# Patient Record
Sex: Female | Born: 1980 | Hispanic: Yes | Marital: Married | Smoking: Never smoker
Health system: Southern US, Community
[De-identification: ages and names within clinical notes are randomized; demographics above are authoritative.]

## PROBLEM LIST (undated history)

## (undated) DIAGNOSIS — R51 Headache: Secondary | ICD-10-CM

## (undated) DIAGNOSIS — R519 Headache, unspecified: Secondary | ICD-10-CM

---

## 2015-02-22 ENCOUNTER — Emergency Department (HOSPITAL_BASED_OUTPATIENT_CLINIC_OR_DEPARTMENT_OTHER)
Admission: EM | Admit: 2015-02-22 | Discharge: 2015-02-23 | Disposition: A | Discharge status: Home / Self Care | Payer: Managed Care, Other (non HMO) | Attending: Emergency Medicine | Admitting: Emergency Medicine

## 2015-02-22 ENCOUNTER — Emergency Department (HOSPITAL_BASED_OUTPATIENT_CLINIC_OR_DEPARTMENT_OTHER): Payer: Managed Care, Other (non HMO)

## 2015-02-22 ENCOUNTER — Encounter (HOSPITAL_BASED_OUTPATIENT_CLINIC_OR_DEPARTMENT_OTHER): Payer: Self-pay | Admitting: *Deleted

## 2015-02-22 DIAGNOSIS — R110 Nausea: Secondary | ICD-10-CM | POA: Diagnosis not present

## 2015-02-22 DIAGNOSIS — R197 Diarrhea, unspecified: Secondary | ICD-10-CM | POA: Diagnosis not present

## 2015-02-22 DIAGNOSIS — Z3202 Encounter for pregnancy test, result negative: Secondary | ICD-10-CM | POA: Insufficient documentation

## 2015-02-22 DIAGNOSIS — R109 Unspecified abdominal pain: Secondary | ICD-10-CM | POA: Diagnosis present

## 2015-02-22 DIAGNOSIS — Z9889 Other specified postprocedural states: Secondary | ICD-10-CM | POA: Insufficient documentation

## 2015-02-22 DIAGNOSIS — R1010 Upper abdominal pain, unspecified: Secondary | ICD-10-CM | POA: Insufficient documentation

## 2015-02-22 DIAGNOSIS — R1031 Right lower quadrant pain: Secondary | ICD-10-CM | POA: Insufficient documentation

## 2015-02-22 LAB — URINALYSIS, ROUTINE W REFLEX MICROSCOPIC
BILIRUBIN URINE: NEGATIVE
Glucose, UA: NEGATIVE mg/dL
Ketones, ur: NEGATIVE mg/dL
Leukocytes, UA: NEGATIVE
NITRITE: NEGATIVE
PH: 7 (ref 5.0–8.0)
Protein, ur: NEGATIVE mg/dL
Specific Gravity, Urine: 1.005 (ref 1.005–1.030)
Urobilinogen, UA: 0.2 mg/dL (ref 0.0–1.0)

## 2015-02-22 LAB — URINE MICROSCOPIC-ADD ON

## 2015-02-22 LAB — PREGNANCY, URINE: Preg Test, Ur: NEGATIVE

## 2015-02-22 MED ORDER — IOHEXOL 300 MG/ML  SOLN
100.0000 mL | Freq: Once | INTRAMUSCULAR | Status: AC | PRN
  Administered 2015-02-23: 100 mL via INTRAVENOUS

## 2015-02-22 MED ORDER — SODIUM CHLORIDE 0.9 % IV SOLN
1000.0000 mL | Freq: Once | INTRAVENOUS | Status: AC
  Administered 2015-02-23: 1000 mL via INTRAVENOUS

## 2015-02-22 MED ORDER — ONDANSETRON HCL 4 MG/2ML IJ SOLN
4.0000 mg | Freq: Once | INTRAMUSCULAR | Status: AC
  Administered 2015-02-23: 4 mg via INTRAVENOUS
  Filled 2015-02-22: qty 2

## 2015-02-22 MED ORDER — SODIUM CHLORIDE 0.9 % IV SOLN
1000.0000 mL | INTRAVENOUS | Status: DC
Start: 2015-02-22 — End: 2015-02-23

## 2015-02-22 MED ORDER — MORPHINE SULFATE 4 MG/ML IJ SOLN
4.0000 mg | Freq: Once | INTRAMUSCULAR | Status: AC
  Administered 2015-02-23: 4 mg via INTRAVENOUS
  Filled 2015-02-22: qty 1

## 2015-02-22 NOTE — ED Notes (Addendum)
C/o abd pain, onset last night at 2000, last ate last night at 1700. Pinpoints pain to R flank and R abd. Also nausea and diarrhea. (denies: vomiting, fever, sob, urinary or vaginal sx, bleeding, flatus, belching abd surgeries or other sx), tried pepto bismol at 1700 tonight (no relief). Reports diarrhea x20 since last night (watery and yellow), LMP 5/28.

## 2015-02-22 NOTE — ED Notes (Signed)
Pt c/o abd pain and nausea x 1 day

## 2015-02-22 NOTE — ED Provider Notes (Signed)
CSN: [id number]     Arrival date & time 02/22/15  2102 History  This chart was scribed for Brenda Booze, MD by Octavia Heir, ED Scribe. This patient was seen in room MH06/MH06 and the patient's care was started at 11:30 PM.    Chief Complaint  Patient presents with  . Abdominal Pain     The history is provided by the patient. No language interpreter was used.    HPI Comments: Brenda Ryan is a 34 y.o. female who presents to the Emergency Department complaining of constant, gradual worsening abdominal pain onset yesterday. Pt has associated diarrhea and nausea. She notes when she ate today, the pain did not get worse. Pt has tried OTC pepto bismol to alleviate the symptoms with no relief.  She denies fever,and trouble urinating.  History reviewed. No pertinent past medical history. Past Surgical History  Procedure Laterality Date  . Cesarean section     History reviewed. No pertinent family history. History  Substance Use Topics  . Smoking status: Never Smoker   . Smokeless tobacco: Not on file  . Alcohol Use: No   OB History    No data available     Review of Systems  Constitutional: Negative for fever.  Gastrointestinal: Positive for nausea, abdominal pain and diarrhea. Negative for vomiting.  All other systems reviewed and are negative.     Allergies  Penicillins  Home Medications   Prior to Admission medications   Not on File   Triage vitals: BP 135/84 mmHg  Pulse 97  Temp(Src) 98.4 F (36.9 C)  Resp 16  Ht 5\' 3"  (1.6 m)  Wt 180 lb (81.647 kg)  BMI 31.89 kg/m2  SpO2 100%  LMP 02/16/2015 Physical Exam  Constitutional: She is oriented to person, place, and time. She appears well-developed and well-nourished. No distress.  HENT:  Head: Normocephalic.  Eyes: Conjunctivae are normal. Pupils are equal, round, and reactive to light. No scleral icterus.  Neck: Normal range of motion. Neck supple. No JVD present. No thyromegaly present.   Cardiovascular: Normal rate and regular rhythm.  Exam reveals no gallop and no friction rub.   No murmur heard. Pulmonary/Chest: Effort normal and breath sounds normal. No respiratory distress. She has no wheezes. She has no rales. She exhibits no tenderness.  Abdominal: Soft. Bowel sounds are normal. She exhibits no distension and no mass. There is tenderness. There is no rebound and no guarding.  Moderate tenderness across the upper abdomen and right mid and lower abdomen No rebound or guarding Bowel sounds decreased Moderate right CVA tenderness, mild left CVA tenderness  Musculoskeletal: Normal range of motion. She exhibits no edema.  Lymphadenopathy:    She has no cervical adenopathy.  Neurological: She is alert and oriented to person, place, and time.  Skin: Skin is warm and dry. No rash noted.  Psychiatric: She has a normal mood and affect. Her behavior is normal. Judgment and thought content normal.  Nursing note and vitals reviewed.   ED Course  Procedures  DIAGNOSTIC STUDIES: Oxygen Saturation is 100% on RA, normal by my interpretation.  COORDINATION OF CARE:  11:32 PM Discussed treatment plan which includes CT scan, nausea medication with pt at bedside and pt agreed to plan.   Labs Review Results for orders placed or performed during the hospital encounter of 02/22/15  Pregnancy, urine  Result Value Ref Range   Preg Test, Ur NEGATIVE NEGATIVE  Urinalysis, Routine w reflex microscopic (not at [facility])  Result Value Ref Range  Color, Urine YELLOW YELLOW   APPearance CLEAR CLEAR   Specific Gravity, Urine 1.005 1.005 - 1.030   pH 7.0 5.0 - 8.0   Glucose, UA NEGATIVE NEGATIVE mg/dL   Hgb urine dipstick TRACE (A) NEGATIVE   Bilirubin Urine NEGATIVE NEGATIVE   Ketones, ur NEGATIVE NEGATIVE mg/dL   Protein, ur NEGATIVE NEGATIVE mg/dL   Urobilinogen, UA 0.2 0.0 - 1.0 mg/dL   Nitrite NEGATIVE NEGATIVE   Leukocytes, UA NEGATIVE NEGATIVE  Urine microscopic-add on  Result  Value Ref Range   Squamous Epithelial / LPF RARE RARE   RBC / HPF 0-2 <3 RBC/hpf   Bacteria, UA RARE RARE  Comprehensive metabolic panel  Result Value Ref Range   Sodium 139 135 - 145 mmol/L   Potassium 3.4 (L) 3.5 - 5.1 mmol/L   Chloride 102 101 - 111 mmol/L   CO2 26 22 - 32 mmol/L   Glucose, Bld 97 65 - 99 mg/dL   BUN 9 6 - 20 mg/dL   Creatinine, Ser 0.45 0.44 - 1.00 mg/dL   Calcium 9.8 8.9 - 40.9 mg/dL   Total Protein 7.8 6.5 - 8.1 g/dL   Albumin 4.5 3.5 - 5.0 g/dL   AST 18 15 - 41 U/L   ALT 22 14 - 54 U/L   Alkaline Phosphatase 114 38 - 126 U/L   Total Bilirubin 1.3 (H) 0.3 - 1.2 mg/dL   GFR calc non Af Amer >60 >60 mL/min   GFR calc Af Amer >60 >60 mL/min   Anion gap 11 5 - 15  Lipase, blood  Result Value Ref Range   Lipase 14 (L) 22 - 51 U/L  CBC with Differential  Result Value Ref Range   WBC 8.0 4.0 - 10.5 K/uL   RBC 4.62 3.87 - 5.11 MIL/uL   Hemoglobin 14.2 12.0 - 15.0 g/dL   HCT 81.1 91.4 - 78.2 %   MCV 90.7 78.0 - 100.0 fL   MCH 30.7 26.0 - 34.0 pg   MCHC 33.9 30.0 - 36.0 g/dL   RDW 95.6 21.3 - 08.6 %   Platelets 351 150 - 400 K/uL   Neutrophils Relative % 66 43 - 77 %   Neutro Abs 5.3 1.7 - 7.7 K/uL   Lymphocytes Relative 26 12 - 46 %   Lymphs Abs 2.1 0.7 - 4.0 K/uL   Monocytes Relative 6 3 - 12 %   Monocytes Absolute 0.5 0.1 - 1.0 K/uL   Eosinophils Relative 2 0 - 5 %   Eosinophils Absolute 0.1 0.0 - 0.7 K/uL   Basophils Relative 0 0 - 1 %   Basophils Absolute 0.0 0.0 - 0.1 K/uL   Imaging Review Ct Abdomen Pelvis W Contrast  02/23/2015   CLINICAL DATA:  Abdominal pain for 1 day.  Nausea and vomiting.  EXAM: CT ABDOMEN AND PELVIS WITH CONTRAST  TECHNIQUE: Multidetector CT imaging of the abdomen and pelvis was performed using the standard protocol following bolus administration of intravenous contrast.  CONTRAST:  OMNIPAQUE IOHEXOL 300 MG/ML  SOLN  COMPARISON:  None.  FINDINGS: Mild dependent changes in the lung bases.  The liver, spleen,  gallbladder, pancreas, adrenal glands, kidneys, abdominal aorta, inferior vena cava, and retroperitoneal lymph nodes are unremarkable. Stomach, small bowel, and colon are not abnormally distended. Small amount of fat in the umbilicus. No free air or free fluid in the abdomen.  Pelvis: Uterus and ovaries are not enlarged. Mild thickening of the wall of the bladder diffusely may indicate cystitis.  Appendix is normal. No free or loculated pelvic fluid collections. No pelvic mass or lymphadenopathy. No destructive bone lesions.  IMPRESSION: Mild diffuse bladder wall thickening may indicate cystitis. Examination is otherwise unremarkable.   Electronically Signed   By: Burman Nieves M.D.   On: 02/23/2015 01:12     MDM   Final diagnoses:  Abdominal pain, unspecified abdominal location    Abdominal pain of uncertain cause. She does have significant tenderness in the right lower quadrant. CT will be obtained to make sure that this is not an atypical presentation of appendicitis. She's given IV hydration, morphine, ondansetron.  CT has come back remarkable only for bladder wall thickening. However, she has no urinary symptoms and urinalysis is normal. WBC is normal with normal differential. Cause for pain is not clear but there is no evidence of serious pathology. She is discharged with prescription for oxycodone have acetaminophen, and ondansetron. She's to return should symptoms worsen.  I personally performed the services described in this documentation, which was scribed in my presence. The recorded information has been reviewed and is accurate. \    Brenda Booze, MD 02/23/15 [phone]

## 2015-02-22 NOTE — ED Notes (Signed)
Dr. Glick at BS 

## 2015-02-23 LAB — COMPREHENSIVE METABOLIC PANEL
ALT: 22 U/L (ref 14–54)
ANION GAP: 11 (ref 5–15)
AST: 18 U/L (ref 15–41)
Albumin: 4.5 g/dL (ref 3.5–5.0)
Alkaline Phosphatase: 114 U/L (ref 38–126)
BILIRUBIN TOTAL: 1.3 mg/dL — AB (ref 0.3–1.2)
BUN: 9 mg/dL (ref 6–20)
CHLORIDE: 102 mmol/L (ref 101–111)
CO2: 26 mmol/L (ref 22–32)
Calcium: 9.8 mg/dL (ref 8.9–10.3)
Creatinine, Ser: 0.64 mg/dL (ref 0.44–1.00)
GFR calc Af Amer: >60 mL/min (ref >60)
Glucose, Bld: 97 mg/dL (ref 65–99)
Potassium: 3.4 mmol/L — ABNORMAL LOW (ref 3.5–5.1)
Sodium: 139 mmol/L (ref 135–145)
Total Protein: 7.8 g/dL (ref 6.5–8.1)

## 2015-02-23 LAB — CBC WITH DIFFERENTIAL/PLATELET
Basophils Absolute: 0 10*3/uL (ref 0.0–0.1)
Basophils Relative: 0 % (ref 0–1)
EOS PCT: 2 % (ref 0–5)
Eosinophils Absolute: 0.1 10*3/uL (ref 0.0–0.7)
HCT: 41.9 % (ref 36.0–46.0)
HEMOGLOBIN: 14.2 g/dL (ref 12.0–15.0)
LYMPHS PCT: 26 % (ref 12–46)
Lymphs Abs: 2.1 10*3/uL (ref 0.7–4.0)
MCH: 30.7 pg (ref 26.0–34.0)
MCHC: 33.9 g/dL (ref 30.0–36.0)
MCV: 90.7 fL (ref 78.0–100.0)
Monocytes Absolute: 0.5 10*3/uL (ref 0.1–1.0)
Monocytes Relative: 6 % (ref 3–12)
NEUTROS ABS: 5.3 10*3/uL (ref 1.7–7.7)
Neutrophils Relative %: 66 % (ref 43–77)
Platelets: 351 10*3/uL (ref 150–400)
RBC: 4.62 MIL/uL (ref 3.87–5.11)
RDW: 12.6 % (ref 11.5–15.5)
WBC: 8 10*3/uL (ref 4.0–10.5)

## 2015-02-23 LAB — LIPASE, BLOOD: LIPASE: 14 U/L — AB (ref 22–51)

## 2015-02-23 MED ORDER — OXYCODONE-ACETAMINOPHEN 5-325 MG PO TABS: 1.0000 | ORAL_TABLET | ORAL | Status: AC | PRN

## 2015-02-23 MED ORDER — ONDANSETRON HCL 4 MG PO TABS: 4.0000 mg | ORAL_TABLET | Freq: Four times a day (QID) | ORAL | Status: AC | PRN

## 2015-02-23 NOTE — ED Notes (Signed)
Pt to CT via stretcher

## 2015-02-23 NOTE — ED Notes (Signed)
Pt alert, NAD, calm, interactive, VSS, reports "feel better, pain decreased, 8/10", up to b/r, steady gait, husband at side.

## 2015-02-23 NOTE — Discharge Instructions (Signed)
Return to the emergency department if pain is getting worse.  Dolor abdominal (Abdominal Pain) El dolor puede tener muchas causas. Normalmente la causa del dolor abdominal no es una enfermedad y Scientist, clinical (histocompatibility and immunogenetics) sin TEFL teacher. Frecuentemente puede controlarse y tratarse en casa. Su mdico le Medical sales representative examen fsico y posiblemente solicite anlisis de sangre y radiografas para ayudar a Chief Strategy Officer la gravedad de su dolor. Sin embargo, en [employer], debe transcurrir ms tiempo antes de que se pueda Clinical research associate una causa evidente del dolor. Antes de llegar a ese punto, es posible que su mdico no sepa si necesita ms pruebas o un tratamiento ms profundo. INSTRUCCIONES PARA EL CUIDADO EN EL HOGAR  Est atento al dolor para ver si hay cambios. Las siguientes indicaciones ayudarn a Architectural technologist que pueda sentir:  [place] solo medicamentos de venta libre o recetados, segn las indicaciones del mdico.  No tome laxantes a menos que se lo haya indicado su mdico.  Pruebe con Neomia Dear dieta lquida absoluta (caldo, t o agua) segn se lo indique su mdico. Introduzca gradualmente una dieta normal, segn su tolerancia. SOLICITE ATENCIN MDICA SI:  Tiene dolor abdominal sin explicacin.  Tiene dolor abdominal relacionado con nuseas o diarrea.  Tiene dolor cuando orina o defeca.  Experimenta dolor abdominal que lo despierta de noche.  Tiene dolor abdominal que empeora o mejora cuando come alimentos.  Tiene dolor abdominal que empeora cuando come alimentos grasosos.  Tiene fiebre. SOLICITE ATENCIN MDICA DE INMEDIATO SI:   El dolor no desaparece en un plazo mximo de 2horas.  No deja de (vomitar).  El Engineer, mining se siente solo en partes del abdomen, como el lado derecho o la parte inferior izquierda del abdomen.  Evaca materia fecal sanguinolenta o negra, de aspecto alquitranado. ASEGRESE DE QUE:  Comprende estas instrucciones.  Controlar su afeccin.  Recibir ayuda de inmediato  si no mejora o si empeora. Document Released: 08/27/2005 Document Revised: 09/01/2013 [facility] Patient Information 2015 [place], [place]. This information is not intended to replace advice given to you by your health care provider. Make sure you discuss any questions you have with your health care provider.  Acetaminophen; Oxycodone tablets Qu es este medicamento? El compuesto ACETAMINOFENO; OXICODONA es un analgsico. Se utiliza para tratar los dolores leves a moderados. Este medicamento puede ser utilizado para otros usos; si tiene alguna pregunta consulte con su proveedor de atencin mdica o con su farmacutico. MARCAS COMERCIALES DISPONIBLES: Endocet, Magnacet, Narvox, Percocet, Perloxx, Primalev, Primlev, Roxicet, Xolox [employer] debo informar a mi profesional de la salud antes de tomar este medicamento? Necesita saber si usted presenta alguno de los [employer] o situaciones: -tumor cerebral -enfermedad de Crohn, enfermedad intestinal inflamatoria o colitis ulcerativa -abuso de drogas o drogadiccin -lesin de la cabeza -problemas cardiacos o circulatorios -si consume alcohol con frecuencia -enfermedad renal o problemas al orinar -enfermedad heptica -enfermedad pulmonar, asma o dificultades al respirar -una reaccin alrgica o inusual al acetaminofeno, a la oxicodona, a otros analgsicos opiceos, a otros medicamentos, alimentos, colorantes o conservantes -si est embarazada o buscando quedar embarazada -si est amamantando a un beb Cmo debo utilizar este medicamento? Tome este medicamento por va oral con un vaso lleno de agua. Siga las instrucciones de la etiqueta del [place]. Tome sus dosis a intervalos regulares. No tome su medicamento con una frecuencia mayor que la indicada. Hable con su pediatra para informarse acerca del uso de este medicamento en nios. Puede requerir Customer service manager. Los pacientes de ms de 65 aos de edad pueden presentar  reacciones ms  fuertes a este medicamento y Pension scheme manager dosis menores. Sobredosis: Pngase en contacto inmediatamente con un centro toxicolgico o una sala de urgencia si usted cree que haya tomado demasiado medicamento. ATENCIN: [employer] es solo para usted. No comparta este medicamento con nadie. Qu sucede si me olvido de una dosis? Si olvida una dosis, tmela lo antes posible. Si es casi la hora de la prxima dosis, tome slo esa dosis. No tome dosis adicionales o dobles. Qu puede interactuar con este medicamento? -alcohol -antihistamnicos -barbitricos tales como el amobarbital, butalbital, butabarbital, metohexital, pentobarbital, fenobarbital, tiopental y secobarbital -benztropina -medicamentos para problemas de vejiga, tales como solifenacina, trospium, oxibutinina, tolterodina, hiosciamina y metscopolamina -medicamentos para problemas respiratorios, tales como ipratropio y tiotropio -medicamentos para ciertos problemas estomacales o intestinales, tales como propantelina, homatropina metilbromuro, Dispensing optician, atropina, belladona y diciclomina -anestsicos generales, tales como etomidato, [place], xido nitroso, propofol, desflurano, enflurano, halotano, isoflurano y sevoflurano -medicamentos para la depresin, ansiedad o trastornos psicticos -medicamentos para dormir -relajantes musculares -naltrexona -medicamentos narcticos (opiceos) para Chief Technology Officer -fenotiazinas, tales como perfenacina, tioridazina, clorpromacina, mesoridazina, flufenazina, proclorperazina, promazina y trifluoperazina -escopolamina -tramadol -trihexifenidilo Puede ser que esta lista no menciona todas las posibles interacciones. Informe a su profesional de [employer] de [employer] productos a base de hierbas, medicamentos de [place] o suplementos nutritivos que est tomando. Si usted fuma, consume bebidas alcohlicas o si utiliza drogas ilegales, indqueselo tambin a su profesional de [employer]. Algunas sustancias  pueden interactuar con su medicamento. A qu debo estar atento al usar [employer]? Si el dolor no desaparece, si empeora o si experimenta un dolor nuevo o de tipo diferente, consulte a su mdico o a su profesional de [employer]. Usted puede desarrollar tolerancia al medicamento. La tolerancia significa que necesitar una dosis ms alta para Engineer, materials. Tolerancia es normal y esperada cuando est tomando este medicamento por un largo perodo de [place]. No suspenda el uso de su medicamento repentinamente debido a que puede Copywriter, advertising reaccin severa. Su cuerpo se acostumbra a Industrial/product designer. Esto NO significa que sea adicto. La adiccin es un comportamiento que hace referencia a la obtencin y utilizacin de un medicamento con fines que no son mdicos. Si tiene Engineer, mining, existe una razn mdica para que usted tome un analgsico. Su mdico le indicar la cantidad de medicamento que Mudlogger. Si su mdico desea que [employer], la dosis ser reducida gradualmente para Psychiatric nurse secundarios. Puede experimentar somnolencia o mareos. No conduzca ni utilice maquinaria ni haga nada que Scientist, research (life sciences) en estado de alerta hasta que sepa cmo le afecta este medicamento. No se siente ni se ponga de pie con rapidez, especialmente si es un paciente de edad avanzada. Esto reduce el riesgo de mareos o [employer]. El alcohol puede interferir con el efecto de [place]. Evite consumir bebidas alcohlicas. Hay distintos tipos de medicamentos narcticos (opiceos) para Chief Technology Officer. Si usted toma ms que un tipo a la [employer], podr tener ms [employer]. Dar a su proveedor de atencin medica una lista de todos los medicamentos que usted [place]. Su mdico le informar la cantidad de medicamento que Loss adjuster, chartered. No tome ms medicamento que lo indicado. Comunquese con emergencia para ayuda si tiene problemas para respirar. Este medicamento causar estreimiento. Trate de evacuar  los intestinos al menos cada 2  3 das. Si no evacua los intestinos durante [address]  [address], comunquese con su mdico o con su profesional de [employer]. No tome Tylenol (acetaminofeno) u  otros medicamentos que contienen acetaminofeno con [employer]. Tomando mucho acetaminofeno puede ser muy peligroso. Muchos medicamentos de venta libre contienen acetaminofeno. Lea siempre las etiquetas cuidadosamente para evitar el tomar ms acetaminofeno. Qu efectos secundarios puedo tener al [employer] este medicamento? Efectos secundarios que debe informar a su mdico o a Producer, television/film/video de la salud tan pronto como sea posible: -Scientist, physiological, tales como erupcin cutnea, picazn o urticarias, hinchazn de la cara, labios o lengua -dificultades respiratorias, sibilancias -confusin -sensacin de desmayos o aturdimiento -dolor de estmago severo -cansancio o debilidad inusual -color amarillento de los ojos o la piel Efectos secundarios que, por lo general, no requieren atencin mdica (debe informarlos a su mdico o a su profesional de la salud si persisten o si son molestos): -mareos -somnolencia -nuseas -vmitos Puede ser que esta lista no menciona todos los posibles efectos secundarios. Comunquese a su mdico por asesoramiento mdico [employer]. Usted puede informar los efectos secundarios a la FDA por telfono al 1-800-FDA-1088. Dnde debo guardar mi medicina? Mantngala fuera del alcance de los nios. Este medicamento puede ser abusado. Mantenga su medicamento en un lugar seguro para protegerlo contra robos. No comparta este medicamento con nadie. Es peligroso vender o ceder este medicamento y est prohibido por la ley. Gurdelo a [employer], entre 20 y 25 grados C (72 y 49 grados F). Mantenga el envase bien cerrado. Protjalo de Statistician. Este medicamento puede causar muerte y sobredosis accidental si es tomado por otros adultos, nios o Neurosurgeon. Tire los medicamentos  que no haya utilizado al inodoro para reducir la posibilidad de dao. No use el medicamento despus de la fecha de vencimiento. ATENCIN: Este folleto es un resumen. Puede ser que no cubra toda la posible informacin. Si usted tiene preguntas acerca de esta medicina, consulte con su mdico, su farmacutico o su profesional de Radiographer, therapeutic.  2015, Elsevier/Gold Standard. (2013-05-11 19:44:45)  Ondansetron tablets Qu es este medicamento? El ONDANSETRN se [place] para tratar las nuseas y el vmito causados por la quimioterapia. Tambin se puede Chemical engineer para prevenir o tratar las nuseas y el vmito despus de una operacin. Este medicamento puede ser utilizado para otros usos; si tiene alguna pregunta consulte con su proveedor de atencin mdica o con su farmacutico. MARCAS COMERCIALES DISPONIBLES: Zofran Qu le debo informar a mi profesional de la salud antes de tomar este medicamento? Necesita saber si usted presenta alguno de los siguientes problemas o situaciones: -enfermedad cardiaca -antecedentes de pulso cardiaco irregular -enfermedad heptica -niveles bajos de magnesio o potasio en la sangre -una reaccin alrgica o inusual al Eduard Clos, granisetrn, a otros medicamentos, alimentos, colorantes o conservantes -si est embarazada o buscando quedar embarazada -si est amamantando a un beb Cmo debo [employer]? Tome este medicamento por va oral con un vaso de agua. Siga las instrucciones de la etiqueta del [place]. Tome sus dosis a intervalos regulares. No tome su medicamento con una frecuencia mayor que la indicada. Hable con su pediatra para informarse acerca del uso de este medicamento en nios. Puede requerir atencin especial. Sobredosis: Pngase en contacto inmediatamente con un centro toxicolgico o una sala de urgencia si usted cree que haya tomado demasiado medicamento. ATENCIN: [employer] es solo para usted. No comparta este medicamento con  nadie. Qu sucede si me olvido de una dosis? Si olvida una dosis, tmela lo antes posible. Si es casi la hora de la prxima dosis, tome slo esa dosis. No tome dosis adicionales o dobles. Qu puede interactuar con [employer]  medicamento? No tome esta medicina con ninguno de los siguientes medicamentos: -apomorfina -ciertos medicamentos para infecciones micticas, tales como fluconazol, quetoconazol, itraconazol, posaconazol, voriconazol -cisapride -dofetilida -dronedarona -pimozida -tioridazina -ziprasidona Esta medicina tambin puede interactuar con los siguientes medicamentos: -carbamazepina -ciertos medicamentos para la depresin, ansiedad o trastornos psicticos -fentanilo -linezolid -IMAOs, tales como Carbex, Eldepryl, Marplan, Nardil y [place]arn[place] -azul de metileno (inyeccin por va intravenosa) -otros medicamentos que prolongan el intervalo QT (causa un ritmo cardiaco anormal) -fenitona -rifampicina -tramadol Puede ser que esta lista no menciona todas las posibles interacciones. Informe a su profesional de [employer] de [employer] productos a base de hierbas, medicamentos de [place] o suplementos nutritivos que est tomando. Si usted fuma, consume bebidas alcohlicas o si utiliza drogas ilegales, indqueselo tambin a su profesional de [employer]. Algunas sustancias pueden interactuar con su medicamento. A qu debo estar atento al usar [employer]? Si experimenta algn signo de reaccin alrgica, consulte a su mdico o a su profesional de [employer] lo antes posible. Qu efectos secundarios puedo tener al [employer] este medicamento? Efectos secundarios que debe informar a su mdico o a Producer, television/film/video de la salud tan pronto como sea posible: -Therapist, art como erupcin cutnea, picazn o urticarias, hinchazn de la cara, labios o lengua -problemas respiratorios -confusin -mareos -pulso cardiaco rpido o irregular -sensacin de desmayos o aturdimiento, cadas -fiebre y  escalofros -prdida del equilibrio o coordinacin -convulsiones -sudoracin -hinchazn de las manos o pies -opresin en el pecho -temblores -cansancio o debilidad inusual Efectos secundarios que, por lo general, no requieren atencin mdica (debe informarlos a su mdico o a su profesional de la salud si persisten o si son molestos): -estreimiento o diarrea -dolor de cabeza Puede ser que esta lista no menciona todos los posibles efectos secundarios. Comunquese a su mdico por asesoramiento mdico [employer]. Usted puede informar los efectos secundarios a la FDA por telfono al 1-800-FDA-1088. Dnde debo guardar mi medicina? Mantngala fuera del alcance de los nios. Gurdela a una temperatura de [place] 2 y 30 grados C (36 y 33 grados F). Deseche todo el medicamento que no haya utilizado, despus de la fecha de vencimiento. ATENCIN: Este folleto es un resumen. Puede ser que no cubra toda la posible informacin. Si usted tiene preguntas acerca de esta medicina, consulte con su mdico, su farmacutico o su profesional de Radiographer, therapeutic.  2015, Elsevier/Gold Standard. (2013-06-16 13:57:48)

## 2016-10-01 ENCOUNTER — Emergency Department (HOSPITAL_BASED_OUTPATIENT_CLINIC_OR_DEPARTMENT_OTHER): Payer: BLUE CROSS/BLUE SHIELD

## 2016-10-01 ENCOUNTER — Encounter (HOSPITAL_BASED_OUTPATIENT_CLINIC_OR_DEPARTMENT_OTHER): Payer: Self-pay | Admitting: *Deleted

## 2016-10-01 ENCOUNTER — Emergency Department (HOSPITAL_BASED_OUTPATIENT_CLINIC_OR_DEPARTMENT_OTHER)
Admission: EM | Admit: 2016-10-01 | Discharge: 2016-10-01 | Disposition: A | Discharge status: Home / Self Care | Payer: BLUE CROSS/BLUE SHIELD | Attending: Emergency Medicine | Admitting: Emergency Medicine

## 2016-10-01 DIAGNOSIS — Y939 Activity, unspecified: Secondary | ICD-10-CM | POA: Insufficient documentation

## 2016-10-01 DIAGNOSIS — W3400XA Accidental discharge from unspecified firearms or gun, initial encounter: Secondary | ICD-10-CM | POA: Insufficient documentation

## 2016-10-01 DIAGNOSIS — Y999 Unspecified external cause status: Secondary | ICD-10-CM | POA: Diagnosis not present

## 2016-10-01 DIAGNOSIS — Y929 Unspecified place or not applicable: Secondary | ICD-10-CM | POA: Insufficient documentation

## 2016-10-01 DIAGNOSIS — S5002XA Contusion of left elbow, initial encounter: Secondary | ICD-10-CM | POA: Insufficient documentation

## 2016-10-01 DIAGNOSIS — Z79899 Other long term (current) drug therapy: Secondary | ICD-10-CM | POA: Diagnosis not present

## 2016-10-01 DIAGNOSIS — S59902A Unspecified injury of left elbow, initial encounter: Secondary | ICD-10-CM | POA: Diagnosis present

## 2016-10-01 MED ORDER — IBUPROFEN 400 MG PO TABS
400.0000 mg | ORAL_TABLET | Freq: Once | ORAL | Status: AC
  Administered 2016-10-01: 400 mg via ORAL
  Filled 2016-10-01: qty 1
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
## 2016-10-01 NOTE — ED Notes (Signed)
[employer] officer at bedside talking to patient
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
## 2017-09-25 ENCOUNTER — Encounter (HOSPITAL_BASED_OUTPATIENT_CLINIC_OR_DEPARTMENT_OTHER): Payer: Self-pay

## 2017-09-25 ENCOUNTER — Emergency Department (HOSPITAL_BASED_OUTPATIENT_CLINIC_OR_DEPARTMENT_OTHER)
Admission: EM | Admit: 2017-09-25 | Discharge: 2017-09-25 | Disposition: A | Discharge status: Home / Self Care | Payer: BLUE CROSS/BLUE SHIELD | Attending: Emergency Medicine | Admitting: Emergency Medicine

## 2017-09-25 ENCOUNTER — Other Ambulatory Visit: Payer: Self-pay

## 2017-09-25 DIAGNOSIS — R51 Headache: Secondary | ICD-10-CM | POA: Diagnosis present

## 2017-09-25 DIAGNOSIS — R519 Headache, unspecified: Secondary | ICD-10-CM

## 2017-09-25 HISTORY — DX: Headache, unspecified: R51.9

## 2017-09-25 HISTORY — DX: Headache: R51

## 2017-09-25 LAB — PREGNANCY, URINE: Preg Test, Ur: NEGATIVE

## 2017-09-25 MED ORDER — KETOROLAC TROMETHAMINE 30 MG/ML IJ SOLN
30.0000 mg | Freq: Once | INTRAMUSCULAR | Status: AC
  Administered 2017-09-25: 30 mg via INTRAVENOUS
  Filled 2017-09-25: qty 1

## 2017-09-25 MED ORDER — SODIUM CHLORIDE 0.9 % IV BOLUS (SEPSIS)
1000.0000 mL | Freq: Once | INTRAVENOUS | Status: AC
  Administered 2017-09-25: 1000 mL via INTRAVENOUS

## 2017-09-25 MED ORDER — DIPHENHYDRAMINE HCL 50 MG/ML IJ SOLN
12.5000 mg | Freq: Once | INTRAMUSCULAR | Status: AC
  Administered 2017-09-25: 12.5 mg via INTRAVENOUS
  Filled 2017-09-25: qty 1

## 2017-09-25 MED ORDER — PROCHLORPERAZINE EDISYLATE 5 MG/ML IJ SOLN
10.0000 mg | Freq: Once | INTRAMUSCULAR | Status: AC
  Administered 2017-09-25: 10 mg via INTRAVENOUS
  Filled 2017-09-25: qty 2

## 2017-09-25 NOTE — ED Triage Notes (Addendum)
Pt c/o headache since Sunday with nausea and photosensitivity, not relieved by OTC meds.  Pt will need interpreter

## 2017-09-25 NOTE — ED Provider Notes (Signed)
[facility] EMERGENCY DEPARTMENT Provider Note   CSN: [id number] Arrival date & time: 09/25/17  1833     History   Chief Complaint Chief Complaint  Patient presents with  . Headache    HPI Brenda Ryan is a 37 y.o. female.  The history is provided by the patient and medical records. A language interpreter was used Dealer(Spanish stratus interpreter).  Headache     Brenda Ryan is a 37 y.o. female  with a PMH of headaches who presents to the Emergency Department complaining of headache x 3 days.  Pain is on the right side of the head, throbbing.  Associated with photophobia and nausea.  No vomiting.  No abdominal pain.  No fever or chills.  No medications taken prior to arrival for her symptoms.  She does have a history of similar headaches.  She has had to go to the emergency department before and got medication which improved symptoms.  No visual changes, dizziness, neck pain.    Past Medical History:  Diagnosis Date  . Headache     There are no active problems to display for this patient.   Past Surgical History:  Procedure Laterality Date  . CESAREAN SECTION      OB History    No data available       Home Medications    Prior to Admission medications   Medication Sig Start Date End Date Taking? Authorizing Provider  ondansetron (ZOFRAN) 4 MG tablet Take 1 tablet (4 mg total) by mouth every 6 (six) hours as needed for nausea or vomiting. 02/23/15   Dione BoozeGlick, David, MD  oxyCODONE-acetaminophen (PERCOCET) 5-325 MG per tablet Take 1 tablet by mouth every 4 (four) hours as needed for moderate pain. 02/23/15   Dione BoozeGlick, David, MD    Family History No family history on file.  Social History Social History   Tobacco Use  . Smoking status: Never Smoker  . Smokeless tobacco: Never Used  Substance Use Topics  . Alcohol use: No  . Drug use: No     Allergies   Penicillins   Review of Systems Review of Systems  Neurological:  Positive for headaches. Negative for dizziness, speech difficulty, weakness, light-headedness and numbness.     Physical Exam Updated Vital Signs BP 124/70 (BP Location: Right Wrist)   Pulse 82   Temp 98.2 F (36.8 C) (Oral)   Resp 18   Ht 5\' 2"  (1.575 m)   Wt 89.8 kg (198 lb)   LMP 09/02/2017   SpO2 100%   BMI 36.21 kg/m   Physical Exam  Constitutional: She is oriented to person, place, and time. She appears well-developed and well-nourished. No distress.  HENT:  Head: Normocephalic and atraumatic.  Mouth/Throat: Oropharynx is clear and moist.  No tenderness of the temporal artery   Eyes: Conjunctivae and EOM are normal. Pupils are equal, round, and reactive to light. No scleral icterus.  No nystagmus   Neck: Normal range of motion. Neck supple.  Full active and passive ROM without pain.  No midline or paraspinal tenderness. No nuchal rigidity or meningeal signs.  Cardiovascular: Normal rate, regular rhythm, normal heart sounds and intact distal pulses.  Pulmonary/Chest: Effort normal and breath sounds normal. No respiratory distress. She has no wheezes. She has no rales.  Abdominal: Soft. Bowel sounds are normal. She exhibits no distension. There is no tenderness. There is no rebound and no guarding.  Musculoskeletal: Normal range of motion.  Lymphadenopathy:    She has no cervical  adenopathy.  Neurological: She is alert and oriented to person, place, and time. She has normal reflexes. No cranial nerve deficit. Coordination normal.  Alert, oriented, thought content appropriate, able to give a coherent history. Speech is clear and goal oriented, able to follow commands.  Cranial Nerves:  II:  Peripheral visual fields grossly normal, pupils equal, round, reactive to light III, IV, VI: EOM intact bilaterally, ptosis not present V,VII: smile symmetric, eyes kept closed tightly against resistance, facial light touch sensation equal VIII: hearing grossly normal IX, X: symmetric  soft palate movement, uvula elevates symmetrically  XI: bilateral shoulder shrug symmetric and strong XII: midline tongue extension 5/5 muscle strength in upper and lower extremities bilaterally including strong and equal grip strength and dorsiflexion/plantar flexion Sensory to light touch normal in all four extremities.  Normal finger-to-nose and rapid alternating movements. No drift.  Skin: Skin is warm and dry. No rash noted. She is not diaphoretic.  Nursing note and vitals reviewed.    ED Treatments / Results  Labs (all labs ordered are listed, but only abnormal results are displayed) Labs Reviewed  PREGNANCY, URINE    EKG  EKG Interpretation None       Radiology No results found.  Procedures Procedures (including critical care time)  Medications Ordered in ED Medications  ketorolac (TORADOL) 30 MG/ML injection 30 mg (30 mg Intravenous Given 09/25/17 2115)    And  prochlorperazine (COMPAZINE) injection 10 mg (10 mg Intravenous Given 09/25/17 2118)    And  diphenhydrAMINE (BENADRYL) injection 12.5 mg (12.5 mg Intravenous Given 09/25/17 2116)    And  sodium chloride 0.9 % bolus 1,000 mL (1,000 mLs Intravenous New Bag/Given 09/25/17 2228)     Initial Impression / Assessment and Plan / ED Course  I have reviewed the triage vital signs and the nursing notes.  Pertinent labs & imaging results that were available during my care of the patient were reviewed by me and considered in my medical decision making (see chart for details).    Brenda Ryan is a 37 y.o. female who presents to ED for headache  c/w their previous headaches. No focal neuro deficits on exam. Migraine cocktail and fluids given.   On re-evaluation, patient feels improved. Headache resolved. The patient denies any neurologic symptoms such as visual changes, focal numbness/weakness, balance problems, confusion, or speech difficulty to suggest a life-threatening intracranial process such as  intracranial hemorrhage or mass. The patient has no clotting risk factors thus venous sinus thrombosis is unlikely. No fevers, neck pain or nuchal rigidity to suggest meningitis. I feel that the patient is safe for discharge home at this time. PCP follow up strongly encouraged. I have reviewed return precautions including development of neurologic symptoms, confusion, lethargy, difficulty speaking, or new/worsening/concerning symptoms. All questions answered.   Final Clinical Impressions(s) / ED Diagnoses   Final diagnoses:  Bad headache    ED Discharge Orders    None       Ward, Chase Picket, PA-C 09/25/17 2317    Loren Racer, MD 09/27/17 [phone]

## 2017-09-25 NOTE — Discharge Instructions (Signed)
It was my pleasure taking care of you today!  Drink plenty of fluids at home. This will help with your headache.  Please follow up with your primary care doctor. If you do not have one, please see the handout attaches.  Fortunately, your evaluation today is reassuring with no apparent emergent cause for your headache at this time. With that being said, it is VERY important that you monitor your symptoms at home. If you develop worsening headache, new fever, new neck stiffness, rash, weakness, numbness, trouble with your speech, trouble walking, new or worsening symptoms or any concerning symptoms, please return to the ED immediately.
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
## 2022-10-13 ENCOUNTER — Other Ambulatory Visit: Payer: Self-pay

## 2022-10-13 ENCOUNTER — Emergency Department (HOSPITAL_BASED_OUTPATIENT_CLINIC_OR_DEPARTMENT_OTHER)
Admission: EM | Admit: 2022-10-13 | Discharge: 2022-10-14 | Disposition: A | Discharge status: Home / Self Care | Payer: Self-pay | Attending: Emergency Medicine | Admitting: Emergency Medicine

## 2022-10-13 ENCOUNTER — Emergency Department (HOSPITAL_BASED_OUTPATIENT_CLINIC_OR_DEPARTMENT_OTHER): Payer: Self-pay

## 2022-10-13 DIAGNOSIS — Z1152 Encounter for screening for COVID-19: Secondary | ICD-10-CM | POA: Insufficient documentation

## 2022-10-13 DIAGNOSIS — R42 Dizziness and giddiness: Secondary | ICD-10-CM | POA: Insufficient documentation

## 2022-10-13 DIAGNOSIS — R519 Headache, unspecified: Secondary | ICD-10-CM | POA: Insufficient documentation

## 2022-10-13 LAB — URINALYSIS, ROUTINE W REFLEX MICROSCOPIC
Bilirubin Urine: NEGATIVE
Glucose, UA: NEGATIVE mg/dL
Hgb urine dipstick: NEGATIVE
Ketones, ur: NEGATIVE mg/dL
Leukocytes,Ua: NEGATIVE
Nitrite: NEGATIVE
Protein, ur: NEGATIVE mg/dL
Specific Gravity, Urine: 1.025 (ref 1.005–1.030)
pH: 6.5 (ref 5.0–8.0)

## 2022-10-13 LAB — BASIC METABOLIC PANEL
Anion gap: 5 (ref 5–15)
BUN: 11 mg/dL (ref 6–20)
CO2: 27 mmol/L (ref 22–32)
Calcium: 8.2 mg/dL — ABNORMAL LOW (ref 8.9–10.3)
Chloride: 104 mmol/L (ref 98–111)
Creatinine, Ser: 0.77 mg/dL (ref 0.44–1.00)
GFR, Estimated: >60 mL/min (ref >60)
Glucose, Bld: 130 mg/dL — ABNORMAL HIGH (ref 70–99)
Potassium: 3.7 mmol/L (ref 3.5–5.1)
Sodium: 136 mmol/L (ref 135–145)

## 2022-10-13 LAB — RESP PANEL BY RT-PCR (RSV, FLU A&B, COVID)  RVPGX2
Influenza A by PCR: NEGATIVE
Influenza B by PCR: NEGATIVE
Resp Syncytial Virus by PCR: NEGATIVE
SARS Coronavirus 2 by RT PCR: NEGATIVE

## 2022-10-13 LAB — CBG MONITORING, ED: Glucose-Capillary: 122 mg/dL — ABNORMAL HIGH (ref 70–99)

## 2022-10-13 LAB — CBC
HCT: 36.6 % (ref 36.0–46.0)
Hemoglobin: 12.5 g/dL (ref 12.0–15.0)
MCH: 30.5 pg (ref 26.0–34.0)
MCHC: 34.2 g/dL (ref 30.0–36.0)
MCV: 89.3 fL (ref 80.0–100.0)
Platelets: 349 10*3/uL (ref 150–400)
RBC: 4.1 MIL/uL (ref 3.87–5.11)
RDW: 12.1 % (ref 11.5–15.5)
WBC: 10.7 10*3/uL — ABNORMAL HIGH (ref 4.0–10.5)
nRBC: 0 % (ref 0.0–0.2)

## 2022-10-13 LAB — PREGNANCY, URINE: Preg Test, Ur: NEGATIVE

## 2022-10-13 MED ORDER — METOCLOPRAMIDE HCL 5 MG/ML IJ SOLN
10.0000 mg | Freq: Once | INTRAMUSCULAR | Status: AC
  Administered 2022-10-13: 10 mg via INTRAVENOUS
  Filled 2022-10-13: qty 2

## 2022-10-13 MED ORDER — MAGNESIUM SULFATE 2 GM/50ML IV SOLN
2.0000 g | Freq: Once | INTRAVENOUS | Status: AC
  Administered 2022-10-13: 2 g via INTRAVENOUS
  Filled 2022-10-13: qty 50

## 2022-10-13 MED ORDER — KETOROLAC TROMETHAMINE 30 MG/ML IJ SOLN
30.0000 mg | Freq: Once | INTRAMUSCULAR | Status: AC
  Administered 2022-10-13: 30 mg via INTRAVENOUS
  Filled 2022-10-13: qty 1

## 2022-10-13 MED ORDER — MECLIZINE HCL 25 MG PO TABS
12.5000 mg | ORAL_TABLET | Freq: Once | ORAL | Status: AC
  Administered 2022-10-13: 12.5 mg via ORAL
  Filled 2022-10-13: qty 1

## 2022-10-13 MED ORDER — DIPHENHYDRAMINE HCL 50 MG/ML IJ SOLN
12.5000 mg | Freq: Once | INTRAMUSCULAR | Status: AC
  Administered 2022-10-13: 12.5 mg via INTRAVENOUS
  Filled 2022-10-13: qty 1

## 2022-10-13 NOTE — ED Provider Notes (Signed)
[facility] Provider Note   CSN: [id number] Arrival date & time: 10/13/22  2206     History  Chief Complaint  Patient presents with   Dizziness   Headache    Brenda Ryan is a 42 y.o. female.  The history is provided by the patient. The history is limited by a language barrier. A language interpreter was used.  Dizziness Quality:  Lightheadedness Severity:  Moderate Onset quality:  Gradual Duration:  3 days Timing:  Constant Progression:  Unchanged Chronicity:  Recurrent Relieved by:  Nothing Worsened by:  Nothing Ineffective treatments:  None tried Associated symptoms: headaches   Associated symptoms: no chest pain, no syncope, no vision changes, no vomiting and no weakness   Risk factors: no anemia   Headache Pain location:  Frontal Onset quality:  Gradual Duration:  3 days Timing:  Constant Progression:  Unchanged Chronicity:  Recurrent Similar to prior headaches: yes   Context: not activity   Relieved by:  Nothing Worsened by:  Nothing Associated symptoms: no fever, no neck stiffness, no numbness, no paresthesias, no photophobia, no seizures, no sinus pressure, no swollen glands, no syncope, no tingling, no URI, no visual change, no vomiting and no weakness        Home Medications Prior to Admission medications   Medication Sig Start Date End Date Taking? Authorizing Provider  ondansetron (ZOFRAN) 4 MG tablet Take 1 tablet (4 mg total) by mouth every 6 (six) hours as needed for nausea or vomiting. 8/52/77   Delora Fuel, MD  oxyCODONE-acetaminophen (PERCOCET) 5-325 MG per tablet Take 1 tablet by mouth every 4 (four) hours as needed for moderate pain. 05/03/22   Delora Fuel, MD      Allergies    Penicillins    Review of Systems   Review of Systems  Constitutional:  Negative for fever.  HENT:  Negative for sinus pressure.   Eyes:  Negative for photophobia.  Respiratory:  Negative for wheezing  and stridor.   Cardiovascular:  Negative for chest pain and syncope.  Gastrointestinal:  Negative for vomiting.  Musculoskeletal:  Negative for neck stiffness.  Neurological:  Positive for light-headedness and headaches. Negative for seizures, facial asymmetry, speech difficulty, weakness, numbness and paresthesias.  All other systems reviewed and are negative.   Physical Exam Updated Vital Signs BP 137/80 (BP Location: Left Arm)   Pulse 66   Temp 97.7 F (36.5 C) (Oral)   Resp 16   SpO2 100%  Physical Exam Vitals and nursing note reviewed. Exam conducted with a chaperone present.  Constitutional:      General: She is not in acute distress.    Appearance: Normal appearance. She is well-developed.  HENT:     Head: Normocephalic and atraumatic.     Right Ear: Tympanic membrane normal.     Left Ear: Tympanic membrane normal.     Nose: Nose normal.     Mouth/Throat:     Mouth: Mucous membranes are moist.  Eyes:     Extraocular Movements: Extraocular movements intact.     Pupils: Pupils are equal, round, and reactive to light.  Cardiovascular:     Rate and Rhythm: Normal rate and regular rhythm.     Pulses: Normal pulses.     Heart sounds: Normal heart sounds.  Pulmonary:     Effort: Pulmonary effort is normal. No respiratory distress.     Breath sounds: Normal breath sounds.  Abdominal:     General: Bowel sounds are  normal. There is no distension.     Palpations: Abdomen is soft.     Tenderness: There is no abdominal tenderness. There is no guarding or rebound.  Genitourinary:    Vagina: No vaginal discharge.  Musculoskeletal:        General: Normal range of motion.     Cervical back: Normal range of motion and neck supple. No rigidity.  Lymphadenopathy:     Cervical: No cervical adenopathy.  Skin:    General: Skin is warm and dry.     Capillary Refill: Capillary refill takes less than 2 seconds.     Findings: No erythema or rash.  Neurological:     General: No focal  deficit present.     Mental Status: She is alert and oriented to person, place, and time.     Cranial Nerves: No cranial nerve deficit.     Deep Tendon Reflexes: Reflexes normal.  Psychiatric:        Mood and Affect: Mood normal.        Behavior: Behavior normal.     ED Results / Procedures / Treatments   Labs (all labs ordered are listed, but only abnormal results are displayed) Results for orders placed or performed during the hospital encounter of 10/13/22  Resp panel by RT-PCR (RSV, Flu A&B, Covid) Anterior Nasal Swab   Specimen: Anterior Nasal Swab  Result Value Ref Range   SARS Coronavirus 2 by RT PCR NEGATIVE NEGATIVE   Influenza A by PCR NEGATIVE NEGATIVE   Influenza B by PCR NEGATIVE NEGATIVE   Resp Syncytial Virus by PCR NEGATIVE NEGATIVE  Basic metabolic panel  Result Value Ref Range   Sodium 136 135 - 145 mmol/L   Potassium 3.7 3.5 - 5.1 mmol/L   Chloride 104 98 - 111 mmol/L   CO2 27 22 - 32 mmol/L   Glucose, Bld 130 (H) 70 - 99 mg/dL   BUN 11 6 - 20 mg/dL   Creatinine, Ser 6.30 0.44 - 1.00 mg/dL   Calcium 8.2 (L) 8.9 - 10.3 mg/dL   GFR, Estimated >16 >01 mL/min   Anion gap 5 5 - 15  CBC  Result Value Ref Range   WBC 10.7 (H) 4.0 - 10.5 K/uL   RBC 4.10 3.87 - 5.11 MIL/uL   Hemoglobin 12.5 12.0 - 15.0 g/dL   HCT 09.3 23.5 - 57.3 %   MCV 89.3 80.0 - 100.0 fL   MCH 30.5 26.0 - 34.0 pg   MCHC 34.2 30.0 - 36.0 g/dL   RDW 22.0 25.4 - 27.0 %   Platelets 349 150 - 400 K/uL   nRBC 0.0 0.0 - 0.2 %  Urinalysis, Routine w reflex microscopic -Urine, Clean Catch  Result Value Ref Range   Color, Urine YELLOW YELLOW   APPearance HAZY (A) CLEAR   Specific Gravity, Urine 1.025 1.005 - 1.030   pH 6.5 5.0 - 8.0   Glucose, UA NEGATIVE NEGATIVE mg/dL   Hgb urine dipstick NEGATIVE NEGATIVE   Bilirubin Urine NEGATIVE NEGATIVE   Ketones, ur NEGATIVE NEGATIVE mg/dL   Protein, ur NEGATIVE NEGATIVE mg/dL   Nitrite NEGATIVE NEGATIVE   Leukocytes,Ua NEGATIVE NEGATIVE   Pregnancy, urine  Result Value Ref Range   Preg Test, Ur NEGATIVE NEGATIVE  CBG monitoring, ED  Result Value Ref Range   Glucose-Capillary 122 (H) 70 - 99 mg/dL   CT Head Wo Contrast  Result Date: 10/13/2022 CLINICAL DATA:  Neuro deficit, acute, stroke suspected EXAM: CT HEAD WITHOUT CONTRAST TECHNIQUE: Contiguous  axial images were obtained from the base of the skull through the vertex without intravenous contrast. RADIATION DOSE REDUCTION: This exam was performed according to the departmental dose-optimization program which includes automated exposure control, adjustment of the mA and/or kV according to patient size and/or use of iterative reconstruction technique. COMPARISON:  None Available. FINDINGS: Brain: No evidence of large-territorial acute infarction. No parenchymal hemorrhage. No mass lesion. No extra-axial collection. No mass effect or midline shift. No hydrocephalus. Basilar cisterns are patent. Vascular: No hyperdense vessel. Skull: No acute fracture or focal lesion. Sinuses/Orbits: Paranasal sinuses and mastoid air cells are clear. The orbits are unremarkable. Other: None. IMPRESSION: No acute intracranial abnormality. Electronically Signed   By: Iven Finn M.D.   On: 10/13/2022 23:24    EKG EKG Interpretation  Date/Time:  Saturday October 13 2022 22:21:26 EST Ventricular Rate:  66 PR Interval:  165 QRS Duration: 100 QT Interval:  410 QTC Calculation: 430 R Axis:   61 Text Interpretation: Sinus rhythm Confirmed by Randal Buba, [place] ([id number]) on 10/13/2022 11:01:02 PM  Radiology CT Head Wo Contrast  Result Date: 10/13/2022 CLINICAL DATA:  Neuro deficit, acute, stroke suspected EXAM: CT HEAD WITHOUT CONTRAST TECHNIQUE: Contiguous axial images were obtained from the base of the skull through the vertex without intravenous contrast. RADIATION DOSE REDUCTION: This exam was performed according to the departmental dose-optimization program which includes automated exposure control,  adjustment of the mA and/or kV according to patient size and/or use of iterative reconstruction technique. COMPARISON:  None Available. FINDINGS: Brain: No evidence of large-territorial acute infarction. No parenchymal hemorrhage. No mass lesion. No extra-axial collection. No mass effect or midline shift. No hydrocephalus. Basilar cisterns are patent. Vascular: No hyperdense vessel. Skull: No acute fracture or focal lesion. Sinuses/Orbits: Paranasal sinuses and mastoid air cells are clear. The orbits are unremarkable. Other: None. IMPRESSION: No acute intracranial abnormality. Electronically Signed   By: Iven Finn M.D.   On: 10/13/2022 23:24    Procedures Procedures    Medications Ordered in ED Medications  magnesium sulfate IVPB 2 g 50 mL (2 g Intravenous New Bag/Given 10/13/22 2337)  meclizine (ANTIVERT) tablet 12.5 mg (has no administration in time range)  ketorolac (TORADOL) 30 MG/ML injection 30 mg (30 mg Intravenous Given 10/13/22 2332)  metoCLOPramide (REGLAN) injection 10 mg (10 mg Intravenous Given 10/13/22 2331)  diphenhydrAMINE (BENADRYL) injection 12.5 mg (12.5 mg Intravenous Given 10/13/22 2332)    ED Course/ Medical Decision Making/ A&P                             Medical Decision Making Headache 2 times annually no f/c/r.  No associated symptoms   Amount and/or Complexity of Data Reviewed External Data Reviewed: notes.    Details: Previous notes reviewed  Labs: ordered.    Details: All labs reviewed: pregnancy is negative urine is negative for UTI.  Negative covid and flu.  Normal sodium 136, normal potassium 3.7, normal creatinine .77.  Normal white count 10.7, normal hemoglobin 12.5 normal platelets  Radiology: ordered and independent interpretation performed.    Details: Negative head CT by me  ECG/medicine tests: ordered and independent interpretation performed. Decision-making details documented in ED Course.  Risk Prescription drug management. Risk Details: No  signs of meningitis.  No fevers, no stiff neck no rashes.  No ICH on CT.  I highly doubt dural sinus thrombosis as this is a typical headache.  No proptosis., EOMI, disk margins sharp intact cognition.  Very well appearing with normal exam, vitals labs and imaging.  Stable for discharge.  Strict return.      Final Clinical Impression(s) / ED Diagnoses Final diagnoses:  Headache disorder   Return for intractable cough, coughing up blood, fevers > 100.4 unrelieved by medication, shortness of breath, intractable vomiting, chest pain, shortness of breath, weakness, numbness, changes in speech, facial asymmetry, abdominal pain, passing out, Inability to tolerate liquids or food, cough, altered mental status or any concerns. No signs of systemic illness or infection. The patient is nontoxic-appearing on exam and vital signs are within normal limits.  I have reviewed the triage vital signs and the nursing notes. Pertinent labs & imaging results that were available during my care of the patient were reviewed by me and considered in my medical decision making (see chart for details). After history, exam, and medical workup I feel the patient has been appropriately medically screened and is safe for discharge home. Pertinent diagnoses were discussed with the patient. Patient was given return precautions. Rx / DC Orders ED Discharge Orders     None         Vassar Paone, MD 10/13/22 2357

## 2022-10-13 NOTE — ED Triage Notes (Addendum)
Pt here for dizziness, HA since Thursday morning. Pt denies syncopal episodes. Reports neck pain and HA, no fevers, no sob/cp. Pt endorses nausea w/ no vomiting.
